# Patient Record
Sex: Male | Born: 1975 | Race: White | Hispanic: No | Marital: Single | Smoking: Never smoker
Health system: Southern US, Community
[De-identification: ages and names within clinical notes are randomized; demographics above are authoritative.]

---

## 2013-09-09 ENCOUNTER — Ambulatory Visit: Payer: 59 | Admitting: Emergency Medicine

## 2013-09-09 VITALS — BP 118/70 | HR 109 | Temp 98.7°F | Resp 16 | Ht 71.0 in | Wt 167.0 lb

## 2013-09-09 DIAGNOSIS — S335XXA Sprain of ligaments of lumbar spine, initial encounter: Secondary | ICD-10-CM

## 2013-09-09 MED ORDER — NAPROXEN SODIUM 550 MG PO TABS: 550.0000 mg | ORAL_TABLET | Freq: Two times a day (BID) | ORAL | Status: AC

## 2013-09-09 MED ORDER — CYCLOBENZAPRINE HCL 10 MG PO TABS: 10.0000 mg | ORAL_TABLET | Freq: Three times a day (TID) | ORAL | Status: DC | PRN

## 2013-09-09 MED ORDER — TRAMADOL HCL 50 MG PO TABS: 50.0000 mg | ORAL_TABLET | Freq: Four times a day (QID) | ORAL | Status: DC | PRN

## 2013-09-09 NOTE — Progress Notes (Signed)
[facility] [address] [place] [address] [phone]  Date:  09/09/2013   Name:  Leroy Richardson   DOB:  [birth year]   MRN:  [id number]  PCP:  No PCP Per Patient    Chief Complaint: Back Pain   History of Present Illness:  Leroy Richardson is a 37 y.o. very pleasant male patient who presents with the following:  Says he has experienced intermittent pain in his low back related to sports.  He has never experienced sciatica previously but today developed pain down his left leg from buttock to the foot associated with some paresthesias.  No history of direct injury or overuse.  No weakness. Pain worse when sits.  Less when stands and walks or lays.  No improvement with over the counter medications or other home remedies. Denies other complaint or health concern today.   There are no active problems to display for this patient.   No past medical history on file.  No past surgical history on file.  History  Substance Use Topics  . Smoking status: Never Smoker   . Smokeless tobacco: Not on file  . Alcohol Use: Not on file    No family history on file.  No Known Allergies  Medication list has been reviewed and updated.  No current outpatient prescriptions on file prior to visit.   No current facility-administered medications on file prior to visit.    Review of Systems:  As per HPI, otherwise negative.    Physical Examination: Filed Vitals:   09/09/13 2038  BP: 118/70  Pulse: 109  Temp: 98.7 F (37.1 C)  Resp: 16   Filed Vitals:   09/09/13 2038  Height: 5\' 11"  (1.803 m)  Weight: 167 lb (75.751 kg)   Body mass index is 23.3 kg/(m^2). Ideal Body Weight: Weight in (lb) to have BMI = 25: 178.9   GEN: WDWN, NAD, Non-toxic, Alert & Oriented x 3 HEENT: Atraumatic, Normocephalic.  Ears and Nose: No external deformity. EXTR: No clubbing/cyanosis/edema NEURO: Normal gait.  PSYCH: Normally interactive. Conversant. Not depressed or  anxious appearing.  Calm demeanor.  Back:  Tender left sciatic notch.  SLR negative.  Motor and DTR within normal limits  Assessment and Plan: Lumbar strain with sciatic neuritis Anaprox Flexeril Ultram Local heat  Signed,  Phillips Odor, MD

## 2013-09-09 NOTE — Patient Instructions (Signed)

## 2013-09-12 ENCOUNTER — Telehealth: Payer: Self-pay

## 2013-09-12 NOTE — Telephone Encounter (Signed)
Patient advised.

## 2013-09-12 NOTE — Telephone Encounter (Signed)
Would expect improvement within then next 1-2 wks.  If pain persists after that or is worsening, could consider referral.  At this point, specialist would likely recommend what we have already told him to do

## 2013-09-12 NOTE — Telephone Encounter (Signed)
Patient was seen Monday for a pinched nerve in his back. States that he is still in pain and wants to know what is the time frame for the pain to disappear or if he should schedule an appointment with a specialist instead. [facility] [address]  [phone]

## 2013-09-12 NOTE — Telephone Encounter (Signed)
Is it okay to refer? Or should he come in for recheck?

## 2013-11-22 ENCOUNTER — Ambulatory Visit: Payer: 59 | Admitting: Family Medicine

## 2013-11-22 VITALS — BP 118/80 | HR 94 | Temp 98.2°F | Resp 18 | Ht 70.0 in | Wt 171.2 lb

## 2013-11-22 DIAGNOSIS — J029 Acute pharyngitis, unspecified: Secondary | ICD-10-CM

## 2013-11-22 LAB — POCT RAPID STREP A (OFFICE): Rapid Strep A Screen: NEGATIVE

## 2013-11-22 NOTE — Patient Instructions (Signed)
You most likely have a viral sore throat- it appears that the worst is now over.   Rest, fluids, OTC motrin and throat spray as needed.  Let me know if you are not feeling better in the next few days.

## 2013-11-22 NOTE — Progress Notes (Signed)
[facility] [address], [place] [id number] [phone]  Date:  11/22/2013   Name:  Leroy KillingsJohn Richardson   DOB:  August 18, 1976   MRN:  [id number]  PCP:  No PCP Per Patient    Chief Complaint: Sore Throat and Fever   History of Present Illness:  Leroy Richardson is a 38 y.o. very pleasant male patient who presents with the following:  He is here today with a ST for several days.  He has noted a temp to 102.  Fever is now resolved and he feels somewhat better today.  He feels ok excpet for ST.  He has looked and seen sores on the back of his throat.    He is generally very healthy.    No cough, no GI complaints.  He has felt tired and feverish.     He did have some body aches and chills along with his fever.   No nasal sx.    He has used ibuprofen- last dose was last night.  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   History reviewed. No pertinent past medical history.  History reviewed. No pertinent past surgical history.  History  Substance Use Topics  . Smoking status: Never Smoker   . Smokeless tobacco: Not on file  . Alcohol Use: Not on file    History reviewed. No pertinent family history.  No Known Allergies  Medication list has been reviewed and updated.  Current Outpatient Prescriptions on File Prior to Visit  Medication Sig Dispense Refill  . cyclobenzaprine (FLEXERIL) 10 MG tablet Take 1 tablet (10 mg total) by mouth 3 (three) times daily as needed for muscle spasms.  30 tablet  0  . naproxen sodium (ANAPROX DS) 550 MG tablet Take 1 tablet (550 mg total) by mouth 2 (two) times daily with a meal.  40 tablet  0  . traMADol (ULTRAM) 50 MG tablet Take 1 tablet (50 mg total) by mouth every 6 (six) hours as needed.  30 tablet  0   No current facility-administered medications on file prior to visit.    Review of Systems:  As per HPI- otherwise  negative.   Physical Examination: Filed Vitals:   11/22/13 1101  BP: 118/80  Pulse: 94  Temp: 98.2 F (36.8 C)  Resp: 18   Filed Vitals:   11/22/13 1101  Height: 5\' 10"  (1.778 m)  Weight: 171 lb 3.2 oz (77.656 kg)   Body mass index is 24.56 kg/(m^2). Ideal Body Weight: Weight in (lb) to have BMI = 25: 173.9  GEN: WDWN, NAD, Non-toxic, A & O x 3, looks well HEENT: Atraumatic, Normocephalic. Neck supple. No masses, No LAD.  Bilateral TM wnl, oropharynx shows tiny ulcers on bilateral tonsils.  No tonsillar enlargement or exudate.  PEERL,EOMI.  Ears and Nose: No external deformity. CV: RRR, No M/G/R. No JVD. No thrill. No extra heart sounds. PULM: CTA B, no wheezes, crackles, rhonchi. No retractions. No resp. distress. No accessory muscle use. EXTR: No c/c/e NEURO Normal gait.  PSYCH: Normally interactive. Conversant. Not depressed or anxious appearing.  Calm demeanor.    Results for orders placed in visit on 11/22/13  POCT RAPID STREP A (OFFICE)      Result Value Ref Range   Rapid Strep A Screen Negative  Negative    Assessment and Plan: Acute pharyngitis - Plan: POCT rapid strep A,  CANCELED: Culture, Group A Strep  Suspect viral pharyngitis.  Discussed doing a viral/ HSV culture but he declines this for now.  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  HSV 2 is possible but less likely.  He may have a blood titer in a couple of months if he likes.  He has not had conjunctivitis so doubt adenovirus.   He will continue supportive therapy and let me know if not better.  He does not wish to have a strep culture either as bacterial etiology is unlikely.    Signed Abbe Amsterdam, MD

## 2018-02-14 DIAGNOSIS — Z111 Encounter for screening for respiratory tuberculosis: Secondary | ICD-10-CM | POA: Diagnosis not present

## 2018-06-19 ENCOUNTER — Ambulatory Visit (INDEPENDENT_AMBULATORY_CARE_PROVIDER_SITE_OTHER): Payer: BLUE CROSS/BLUE SHIELD

## 2018-06-19 ENCOUNTER — Ambulatory Visit (INDEPENDENT_AMBULATORY_CARE_PROVIDER_SITE_OTHER): Payer: BLUE CROSS/BLUE SHIELD | Admitting: Sports Medicine

## 2018-06-19 ENCOUNTER — Encounter: Payer: Self-pay | Admitting: Sports Medicine

## 2018-06-19 ENCOUNTER — Encounter (INDEPENDENT_AMBULATORY_CARE_PROVIDER_SITE_OTHER): Payer: Self-pay

## 2018-06-19 DIAGNOSIS — M545 Low back pain, unspecified: Secondary | ICD-10-CM | POA: Insufficient documentation

## 2018-06-19 DIAGNOSIS — M2550 Pain in unspecified joint: Secondary | ICD-10-CM

## 2018-06-19 DIAGNOSIS — Z0000 Encounter for general adult medical examination without abnormal findings: Secondary | ICD-10-CM

## 2018-06-19 DIAGNOSIS — N644 Mastodynia: Secondary | ICD-10-CM | POA: Insufficient documentation

## 2018-06-19 DIAGNOSIS — G8929 Other chronic pain: Secondary | ICD-10-CM

## 2018-06-19 DIAGNOSIS — R5383 Other fatigue: Secondary | ICD-10-CM | POA: Insufficient documentation

## 2018-06-19 MED ORDER — DOXYCYCLINE HYCLATE 100 MG PO TABS: 100.0000 mg | ORAL_TABLET | Freq: Two times a day (BID) | ORAL | 0 refills | Status: AC

## 2018-06-19 NOTE — Assessment & Plan Note (Signed)
Checking routine labs, annual physical today.

## 2018-06-19 NOTE — Assessment & Plan Note (Signed)
Continue naproxen, may use ibuprofen on other days as needed. Adding rheumatoid work-up.

## 2018-06-19 NOTE — Assessment & Plan Note (Signed)
Mostly discogenic back pain. Rehab exercises given, adding some L-spine baseline x-rays.

## 2018-06-19 NOTE — Assessment & Plan Note (Signed)
Left bigger than right breast asymmetry. Fullness on the lateral aspect of the left breast, with tenderness, this may very well be a small abscess. We are going to do 7 days of doxycycline. If symptoms do not completely resolve we will get a full ultrasound evaluation at the breast center.

## 2018-06-19 NOTE — Progress Notes (Signed)
Subjective:    CC: New physical  HPI:  Leroy Richardson is a new patient, here for his physical, he has a few complaints.  Fatigue: Nonspecific, would like testosterone levels checked.  Polyarthralgias: With significant morning stiffness, occasional low back pain, all is very mild.  No family history of rheumatoid arthritis.  Left chest wall pain: With some fullness over the left lateral breast.  No family history of breast cancer.  For about a week and a half.  I reviewed the past medical history, family history, social history, surgical history, and allergies today and no changes were needed.  Please see the problem list section below in epic for further details.  Past Medical History: No past medical history on file. Past Surgical History: No past surgical history on file. Social History: Social History   Socioeconomic History  . Marital status: Single    Spouse name: Not on file  . Number of children: Not on file  . Years of education: Not on file  . Highest education level: Not on file  Occupational History  . Not on file  Social Needs  . Financial resource strain: Not on file  . Food insecurity:    Worry: Not on file    Inability: Not on file  . Transportation needs:    Medical: Not on file    Non-medical: Not on file  Tobacco Use  . Smoking status: Never Smoker  . Smokeless tobacco: Never Used  Substance and Sexual Activity  . Alcohol use: Yes  . Drug use: Never  . Sexual activity: Yes  Lifestyle  . Physical activity:    Days per week: Not on file    Minutes per session: Not on file  . Stress: Not on file  Relationships  . Social connections:    Talks on phone: Not on file    Gets together: Not on file    Attends religious service: Not on file    Active member of club or organization: Not on file    Attends meetings of clubs or organizations: Not on file    Relationship status: Not on file  Other Topics Concern  . Not on file  Social History Narrative  . Not  on file   Family History: Family History  Problem Relation Age of Onset  . Cancer Mother        uterine   Allergies: No Known Allergies Medications: See med rec.  Review of Systems: No headache, visual changes, nausea, vomiting, diarrhea, constipation, dizziness, abdominal pain, skin rash, fevers, chills, night sweats, swollen lymph nodes, weight loss, chest pain, body aches, joint swelling, muscle aches, shortness of breath, mood changes, visual or auditory hallucinations.  Objective:    General: Well Developed, well nourished, and in no acute distress.  Neuro: Alert and oriented x3, extra-ocular muscles intact, sensation grossly intact. Cranial nerves II through XII are intact, motor, sensory, and coordinative functions are all intact. HEENT: Normocephalic, atraumatic, pupils equal round reactive to light, neck supple, no masses, no lymphadenopathy, thyroid nonpalpable. Oropharynx, nasopharynx, external ear canals are unremarkable. Skin: Warm and dry, no rashes noted.  Cardiac: Regular rate and rhythm, no murmurs rubs or gallops.  Respiratory: Clear to auscultation bilaterally. Not using accessory muscles, speaking in full sentences.  Abdominal: Soft, nontender, nondistended, positive bowel sounds, no masses, no organomegaly.  Musculoskeletal: Shoulder, elbow, wrist, hip, knee, ankle stable, and with full range of motion. Chest wall: Asymmetry with enlargement of the left breast, tenderness over the lateral aspect of the  left breast at about the 2 o'clock position, 2 cm from the nipple.  Mild fullness but no discrete mass.  Impression and Recommendations:    The patient was counselled, risk factors were discussed, anticipatory guidance given.  Polyarthralgia Continue naproxen, may use ibuprofen on other days as needed. Adding rheumatoid work-up.  Annual physical exam Checking routine labs, annual physical today.  Fatigue Checking testosterone levels.  Pain of left  breast Left bigger than right breast asymmetry. Fullness on the lateral aspect of the left breast, with tenderness, this may very well be a small abscess. We are going to do 7 days of doxycycline. If symptoms do not completely resolve we will get a full ultrasound evaluation at the breast center.  Low back pain Mostly discogenic back pain. Rehab exercises given, adding some L-spine baseline x-rays. ___________________________________________ Ihor Austinhomas J. Benjamin Stainhekkekandam, M.D., ABFM., CAQSM. Primary Care and Sports Medicine [facility]  Adjunct Instructor of Family Medicine  [employer]

## 2018-06-19 NOTE — Assessment & Plan Note (Signed)
Checking testosterone levels.

## 2018-06-22 DIAGNOSIS — Z0000 Encounter for general adult medical examination without abnormal findings: Secondary | ICD-10-CM | POA: Diagnosis not present

## 2018-06-22 DIAGNOSIS — R5383 Other fatigue: Secondary | ICD-10-CM | POA: Diagnosis not present

## 2018-06-22 DIAGNOSIS — M2550 Pain in unspecified joint: Secondary | ICD-10-CM | POA: Diagnosis not present

## 2018-06-29 LAB — CBC
HCT: 43.4 % (ref 38.5–50.0)
Hemoglobin: 14.8 g/dL (ref 13.2–17.1)
MCH: 28.7 pg (ref 27.0–33.0)
MCHC: 34.1 g/dL (ref 32.0–36.0)
MCV: 84.3 fL (ref 80.0–100.0)
MPV: 9.8 fL (ref 7.5–12.5)
Platelets: 212 Thousand/uL (ref 140–400)
RBC: 5.15 10*6/uL (ref 4.20–5.80)
RDW: 12.1 % (ref 11.0–15.0)
WBC: 3.8 Thousand/uL (ref 3.8–10.8)

## 2018-06-29 LAB — LIPID PANEL W/REFLEX DIRECT LDL
Cholesterol: 186 mg/dL (ref <200)
HDL: 50 mg/dL (ref >40)
LDL Cholesterol (Calc): 121 mg/dL (calc) — ABNORMAL HIGH
Non-HDL Cholesterol (Calc): 136 mg/dL — ABNORMAL HIGH (ref <130)
Total CHOL/HDL Ratio: 3.7 (calc) (ref <5.0)
Triglycerides: 60 mg/dL (ref <150)

## 2018-06-29 LAB — COMPREHENSIVE METABOLIC PANEL
AG Ratio: 2.3 (calc) (ref 1.0–2.5)
ALT: 13 U/L (ref 9–46)
BUN: 15 mg/dL (ref 7–25)
Calcium: 10 mg/dL (ref 8.6–10.3)
Chloride: 103 mmol/L (ref 98–110)
Glucose, Bld: 88 mg/dL (ref 65–99)
Potassium: 4.3 mmol/L (ref 3.5–5.3)
Sodium: 139 mmol/L (ref 135–146)
Total Bilirubin: 0.6 mg/dL (ref 0.2–1.2)
Total Protein: 6.9 g/dL (ref 6.1–8.1)

## 2018-06-29 LAB — COMPREHENSIVE METABOLIC PANEL WITH GFR
AST: 19 U/L (ref 10–40)
Albumin: 4.8 g/dL (ref 3.6–5.1)
Alkaline phosphatase (APISO): 63 U/L (ref 40–115)
CO2: 29 mmol/L (ref 20–32)
Creat: 0.82 mg/dL (ref 0.60–1.35)
Globulin: 2.1 g/dL (ref 1.9–3.7)

## 2018-06-29 LAB — ANA, IFA COMPREHENSIVE PANEL
Anti Nuclear Antibody(ANA): NEGATIVE
ENA SM Ab Ser-aCnc: <1 AI
SM/RNP: <1 AI
SSA (Ro) (ENA) Antibody, IgG: <1 AI
SSB (La) (ENA) Antibody, IgG: <1 AI
Scleroderma (Scl-70) (ENA) Antibody, IgG: <1 AI
ds DNA Ab: <1 IU/mL

## 2018-06-29 LAB — RHEUMATOID ARTHRITIS DIAGNOSTIC PANEL, COMPREHENSIVE
Cyclic Citrullin Peptide Ab: <16 Units (ref <20)
Rheumatoid Factor (IgA): <5 U (ref <=6)
Rheumatoid Factor (IgG): <5 U (ref <=6)
Rheumatoid Factor (IgM): <5 U (ref <=6)
SSA (Ro) (ENA) Antibody, IgG: <1 AI
SSB (La) (ENA) Antibody, IgG: <1 AI

## 2018-06-29 LAB — HEMOGLOBIN A1C
Hgb A1c MFr Bld: 5.1 %{Hb} (ref <5.7)
Mean Plasma Glucose: 100 (calc)
eAG (mmol/L): 5.5 (calc)

## 2018-06-29 LAB — TESTOSTERONE, FREE & TOTAL
Free Testosterone: 53.4 pg/mL (ref 35.0–155.0)
Testosterone, Total, LC-MS-MS: 481 ng/dL (ref 250–1100)

## 2018-06-29 LAB — SEDIMENTATION RATE: Sed Rate: 2 mm/h (ref 0–15)

## 2018-06-29 LAB — TSH: TSH: 1.32 mIU/L (ref 0.40–4.50)

## 2018-06-29 LAB — URIC ACID: Uric Acid, Serum: 5.6 mg/dL (ref 4.0–8.0)

## 2018-06-29 LAB — VITAMIN D 25 HYDROXY (VIT D DEFICIENCY, FRACTURES): Vit D, 25-Hydroxy: 33 ng/mL (ref 30–100)

## 2018-06-29 LAB — CK: Total CK: 71 U/L (ref 44–196)

## 2018-07-03 ENCOUNTER — Encounter: Payer: Self-pay | Admitting: Sports Medicine

## 2018-07-03 ENCOUNTER — Ambulatory Visit (INDEPENDENT_AMBULATORY_CARE_PROVIDER_SITE_OTHER): Payer: BLUE CROSS/BLUE SHIELD | Admitting: Sports Medicine

## 2018-07-03 ENCOUNTER — Telehealth: Payer: Self-pay | Admitting: *Deleted

## 2018-07-03 DIAGNOSIS — N644 Mastodynia: Secondary | ICD-10-CM | POA: Diagnosis not present

## 2018-07-03 DIAGNOSIS — M545 Low back pain: Secondary | ICD-10-CM

## 2018-07-03 DIAGNOSIS — M2550 Pain in unspecified joint: Secondary | ICD-10-CM

## 2018-07-03 DIAGNOSIS — R5383 Other fatigue: Secondary | ICD-10-CM | POA: Diagnosis not present

## 2018-07-03 DIAGNOSIS — G8929 Other chronic pain: Secondary | ICD-10-CM

## 2018-07-03 NOTE — Assessment & Plan Note (Signed)
Resolved, labs including testosterone levels were normal.

## 2018-07-03 NOTE — Assessment & Plan Note (Signed)
Fullness in the left breast. I suspected possibly a small abscess/mastitis, we did 7 days of doxycycline, only slight improvement in pain, fullness is still present. We are going to proceed with full ultrasound evaluation at the breast center.

## 2018-07-03 NOTE — Assessment & Plan Note (Signed)
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 Continue with rehab exercises, naproxen and return as needed.

## 2018-07-03 NOTE — Telephone Encounter (Signed)
Sounds like the doxycycline was effective, no need to come in as long as the asymmetry is improving or resolved.

## 2018-07-03 NOTE — Telephone Encounter (Signed)
Pt left vm stating that the left breast tenderness is almost completely gone and wanted to know if you still wanted him to come in for the f/u this afternoon.  Please advise.

## 2018-07-03 NOTE — Progress Notes (Signed)
Subjective:    CC: Follow-up  HPI: Fatigue: Resolved.  Low back pain: Mild lumbar DDD on x-rays, does well with rehab exercises and naproxen.  Left breast mass: With fullness, we tried a 7-day course of doxycycline, persistent fullness and pain.  I reviewed the past medical history, family history, social history, surgical history, and allergies today and no changes were needed.  Please see the problem list section below in epic for further details.  Past Medical History: No past medical history on file. Past Surgical History: No past surgical history on file. Social History: Social History   Socioeconomic History  . Marital status: Single    Spouse name: Not on file  . Number of children: Not on file  . Years of education: Not on file  . Highest education level: Not on file  Occupational History  . Not on file  Social Needs  . Financial resource strain: Not on file  . Food insecurity:    Worry: Not on file    Inability: Not on file  . Transportation needs:    Medical: Not on file    Non-medical: Not on file  Tobacco Use  . Smoking status: Never Smoker  . Smokeless tobacco: Never Used  Substance and Sexual Activity  . Alcohol use: Yes  . Drug use: Never  . Sexual activity: Yes  Lifestyle  . Physical activity:    Days per week: Not on file    Minutes per session: Not on file  . Stress: Not on file  Relationships  . Social connections:    Talks on phone: Not on file    Gets together: Not on file    Attends religious service: Not on file    Active member of club or organization: Not on file    Attends meetings of clubs or organizations: Not on file    Relationship status: Not on file  Other Topics Concern  . Not on file  Social History Narrative  . Not on file   Family History: Family History  Problem Relation Age of Onset  . Cancer Mother        uterine   Allergies: No Known Allergies Medications: See med rec.  Review of Systems: No fevers,  chills, night sweats, weight loss, chest pain, or shortness of breath.   Objective:    General: Well Developed, well nourished, and in no acute distress.  Neuro: Alert and oriented x3, extra-ocular muscles intact, sensation grossly intact.  HEENT: Normocephalic, atraumatic, pupils equal round reactive to light, neck supple, no masses, no lymphadenopathy, thyroid nonpalpable.  Skin: Warm and dry, no rashes. Cardiac: Regular rate and rhythm, no murmurs rubs or gallops, no lower extremity edema.  Respiratory: Clear to auscultation bilaterally. Not using accessory muscles, speaking in full sentences. Chest wall: Asymmetry with enlargement of the left breast, tenderness over the lateral aspect of the left breast at about the 2 o'clock position, 2 cm from the nipple.  Mild fullness but no discrete mass.  Impression and Recommendations:    Polyarthralgia Continue with naproxen. Rheumatoid work-up is negative, no further intervention needed.  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 Continue with rehab exercises, naproxen and return as needed.  Fatigue Resolved, labs including testosterone levels were normal.  Pain of left breast Fullness in the left breast. I suspected possibly a small abscess/mastitis, we did 7 days of doxycycline, only slight improvement in pain, fullness is still present. We are going to proceed with  full ultrasound evaluation at the breast center. ___________________________________________ Leroy Richardson. Leroy Richardson, M.D., ABFM., CAQSM. Primary Care and Sports Medicine [facility]  Adjunct Instructor of Family Medicine  [employer]

## 2018-07-03 NOTE — Telephone Encounter (Signed)
LMOM for pt to call office to cancel his appointment if that's what he wants to do.

## 2018-07-03 NOTE — Assessment & Plan Note (Signed)
Continue with naproxen. Rheumatoid work-up is negative, no further intervention needed.

## 2018-10-25 ENCOUNTER — Telehealth: Payer: Self-pay | Admitting: Sports Medicine

## 2018-10-25 MED ORDER — SCOPOLAMINE 1 MG/3DAYS TD PT72: 1.0000 | MEDICATED_PATCH | TRANSDERMAL | 3 refills | Status: AC

## 2018-10-25 NOTE — Telephone Encounter (Signed)
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

## 2018-10-29 ENCOUNTER — Ambulatory Visit
Admission: RE | Admit: 2018-10-29 | Discharge: 2018-10-29 | Disposition: A | Discharge status: Home / Self Care | Payer: BLUE CROSS/BLUE SHIELD | Source: Ambulatory Visit | Attending: Sports Medicine | Admitting: Sports Medicine

## 2018-10-29 ENCOUNTER — Ambulatory Visit: Payer: BLUE CROSS/BLUE SHIELD

## 2018-10-29 DIAGNOSIS — N644 Mastodynia: Secondary | ICD-10-CM

## 2018-10-29 DIAGNOSIS — R928 Other abnormal and inconclusive findings on diagnostic imaging of breast: Secondary | ICD-10-CM | POA: Diagnosis not present

## 2018-10-29 DIAGNOSIS — N6489 Other specified disorders of breast: Secondary | ICD-10-CM | POA: Diagnosis not present

## 2019-03-26 DIAGNOSIS — Z20828 Contact with and (suspected) exposure to other viral communicable diseases: Secondary | ICD-10-CM | POA: Diagnosis not present

## 2019-09-20 IMAGING — MG DIGITAL DIAGNOSTIC BILATERAL MAMMOGRAM WITH TOMO AND CAD
6 of 10 series · 6 of 30 positions shown · non-contrast
Comparison: Previous exam(s).

CLINICAL DATA: Focal pain and palpable lump left breast.

EXAM:
DIGITAL DIAGNOSTIC BILATERAL MAMMOGRAM WITH CAD AND TOMO
ULTRASOUND LEFT BREAST

[R CC synth-2D]
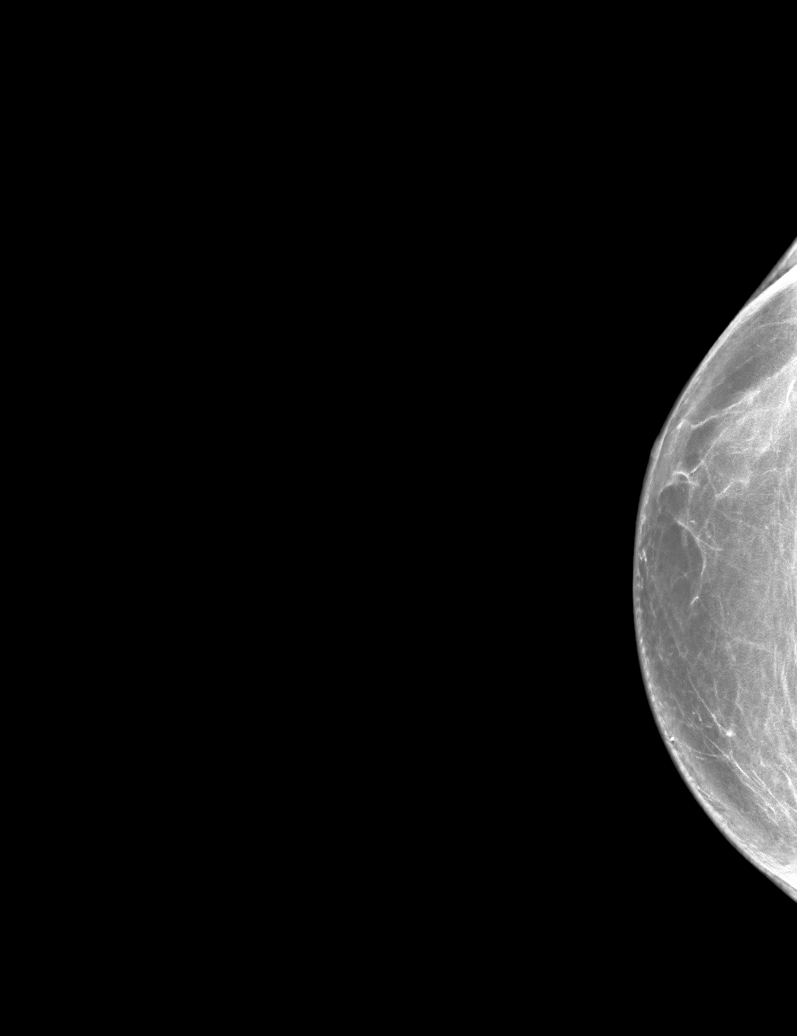

[L TAN synth-2D]
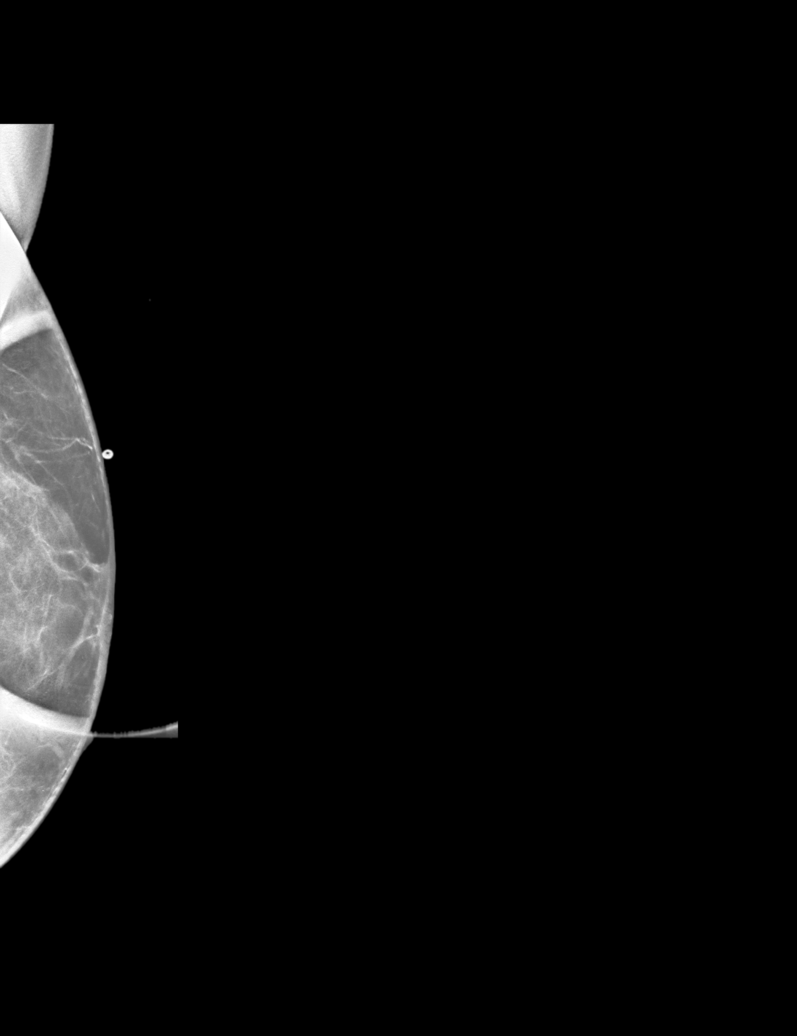

[R MLO synth-2D]
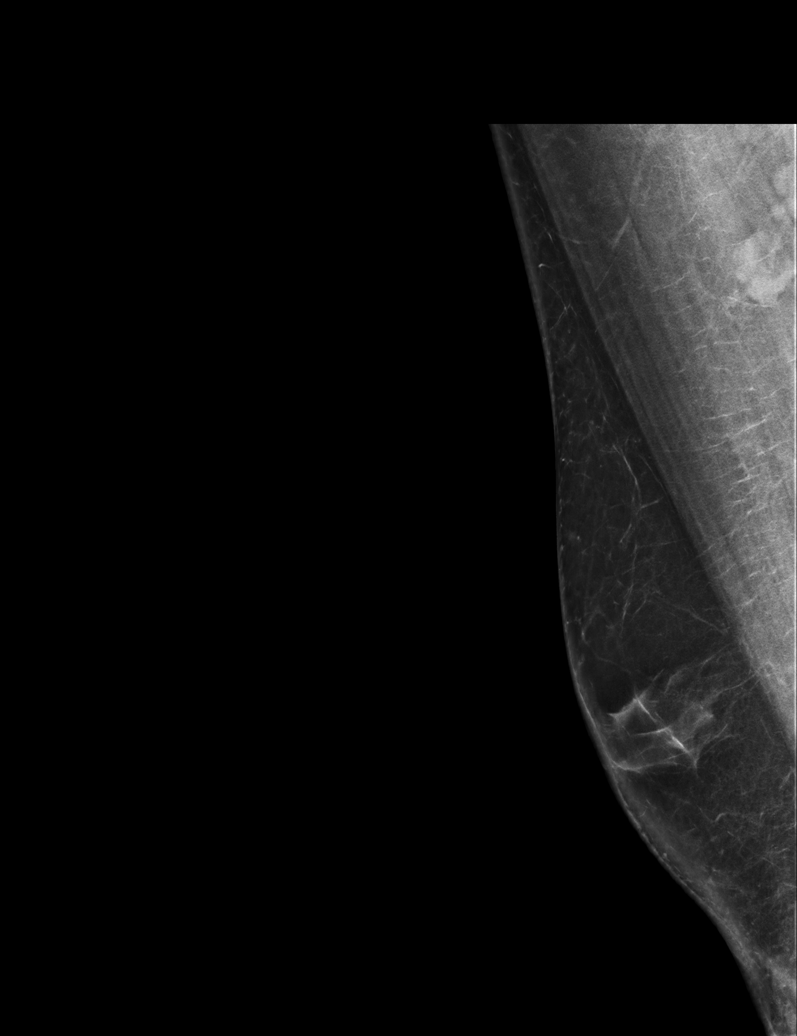

[L CC synth-2D]
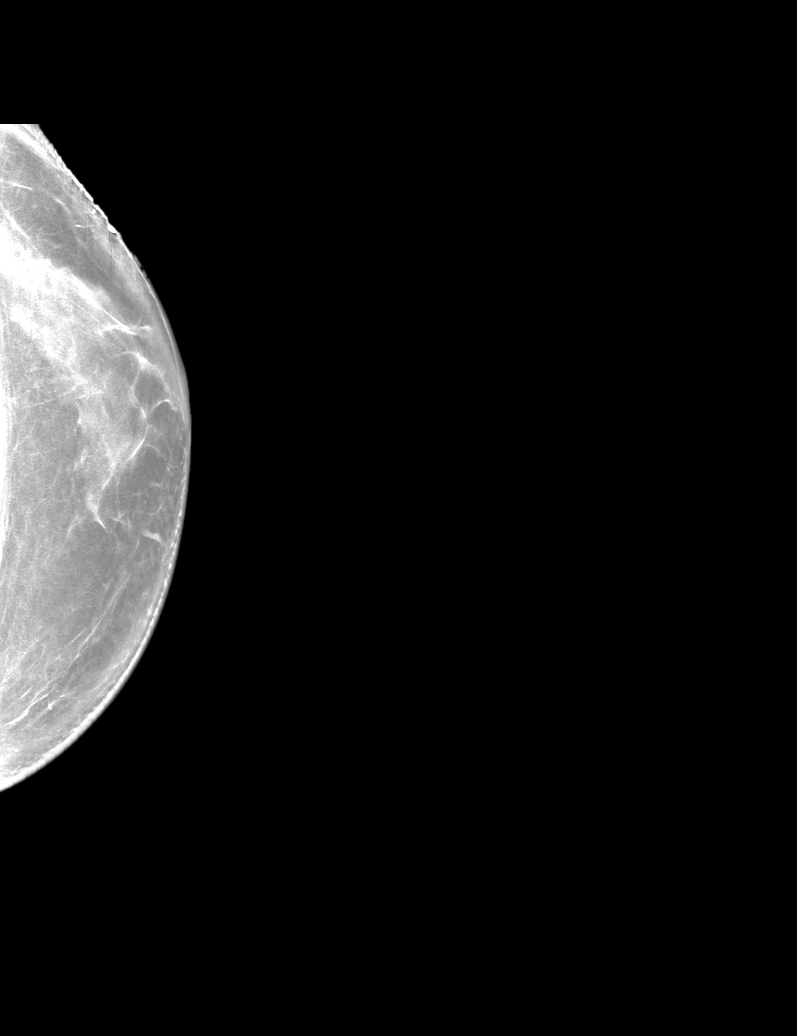

[L MLO synth-2D]
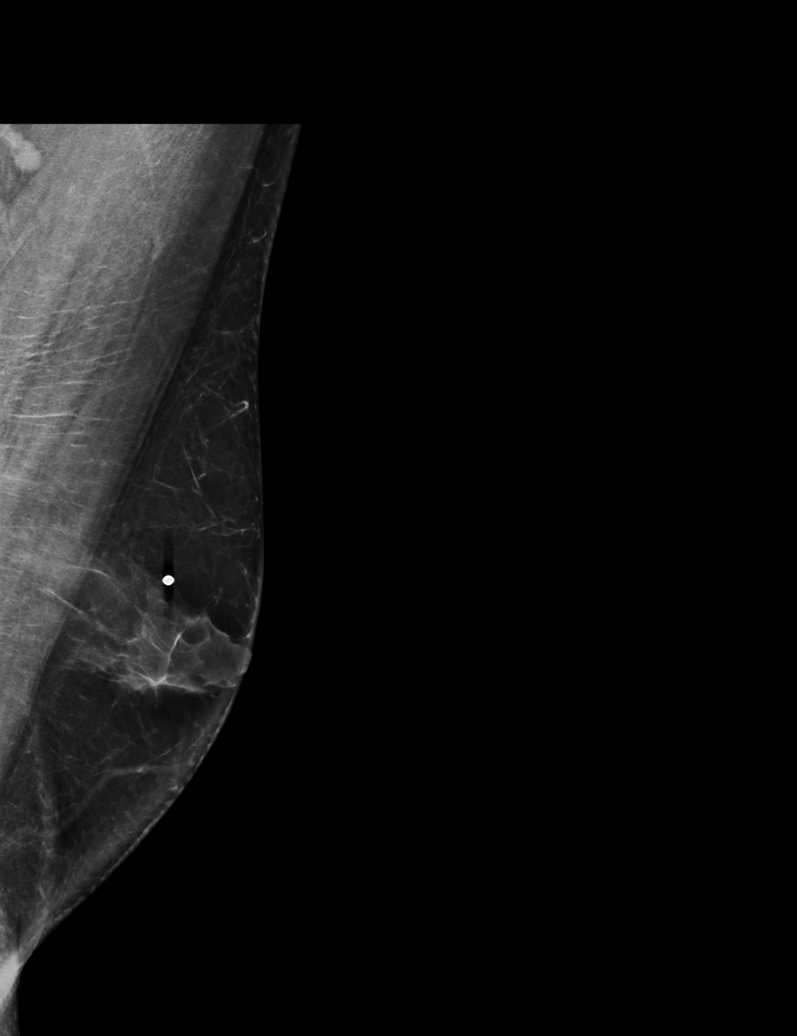

[R CC tomo · tomo slice 39/78.0]
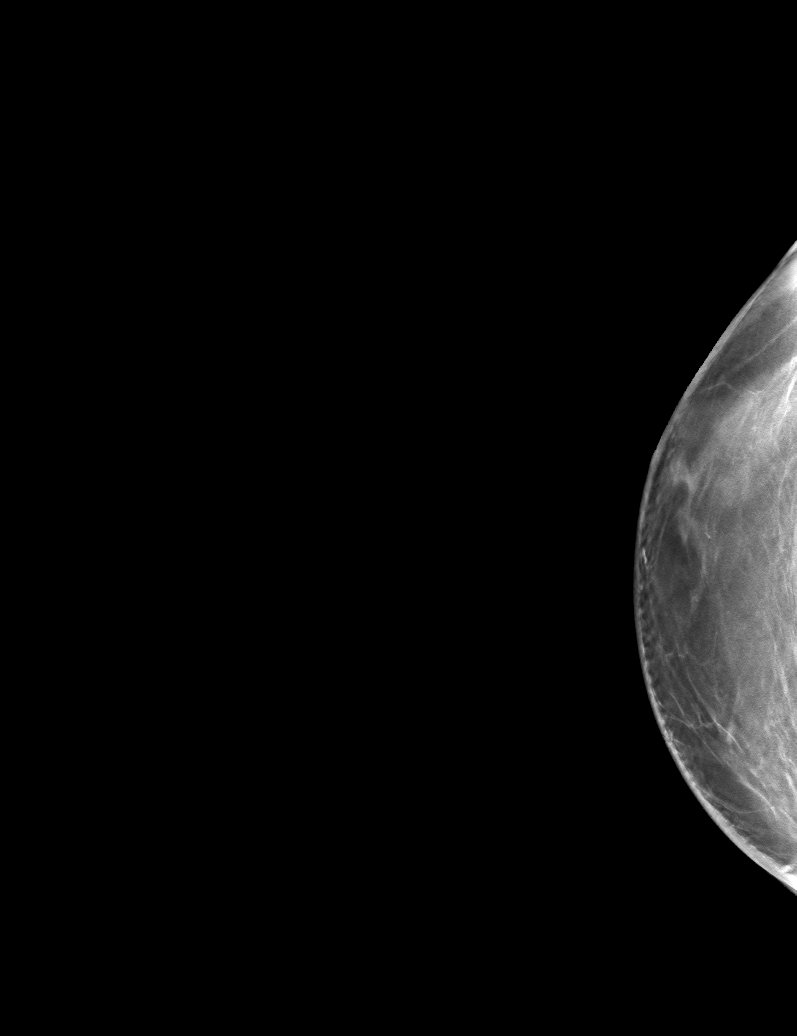

[6 of 30 positions shown; findings below may reference images not displayed]

ACR Breast Density Category b: There are scattered areas of
fibroglandular density.
FINDINGS: Cc and MLO views of bilateral breasts, spot tangential view of left
breast are submitted. There is bilateral gynecomastia, left greater
than right.

Mammographic images were processed with CAD.

Targeted ultrasound is performed, showing changes of gynecomastia in
the palpable area upper-outer quadrant left breast. No focal
abnormal discrete cystic or solid lesion is identified.
IMPRESSION: Benign findings.

RECOMMENDATION:
Management on clinical basis.

I have discussed the findings and recommendations with the patient.
Results were also provided in writing at the conclusion of the
visit. If applicable, a reminder letter will be sent to the patient
regarding the next appointment.

BI-RADS CATEGORY  2: Benign.

## 2019-11-05 DIAGNOSIS — L718 Other rosacea: Secondary | ICD-10-CM | POA: Diagnosis not present

## 2019-11-05 DIAGNOSIS — B351 Tinea unguium: Secondary | ICD-10-CM | POA: Diagnosis not present

## 2019-12-01 ENCOUNTER — Ambulatory Visit: Payer: BC Managed Care – PPO | Attending: Internal Medicine

## 2019-12-01 DIAGNOSIS — Z23 Encounter for immunization: Secondary | ICD-10-CM

## 2019-12-01 NOTE — Progress Notes (Signed)
   Covid-19 Vaccination Clinic  Name:  Champ Keetch    MRN: [id number] DOB: October 12, 1975  12/01/2019  Mr. Purdy was observed post Covid-19 immunization for 15 minutes without incident. He was provided with Vaccine Information Sheet and instruction to access the V-Safe system.   Mr. Pizzi was instructed to call 911 with any severe reactions post vaccine: Marland Kitchen Difficulty breathing  . Swelling of face and throat  . A fast heartbeat  . A bad rash all over body  . Dizziness and weakness   Immunizations Administered    Name Date Dose VIS Date Route   Pfizer COVID-19 Vaccine 12/01/2019  5:11 PM 0.3 mL 09/06/2019 Intramuscular   Manufacturer: [employer], [employer]   Lot: [id number]   NDC: [address]-8

## 2019-12-03 DIAGNOSIS — L718 Other rosacea: Secondary | ICD-10-CM | POA: Diagnosis not present

## 2020-01-01 ENCOUNTER — Ambulatory Visit: Payer: BC Managed Care – PPO | Attending: Internal Medicine

## 2020-01-01 DIAGNOSIS — Z23 Encounter for immunization: Secondary | ICD-10-CM

## 2020-01-01 NOTE — Progress Notes (Signed)
   Covid-19 Vaccination Clinic  Name:  Lorimer Tiberio    MRN: [id number] DOB: 1975-11-02  01/01/2020  Mr. Hassebrock was observed post Covid-19 immunization for 15 minutes without incident. He was provided with Vaccine Information Sheet and instruction to access the V-Safe system.   Mr. Vansickle was instructed to call 911 with any severe reactions post vaccine: Marland Kitchen Difficulty breathing  . Swelling of face and throat  . A fast heartbeat  . A bad rash all over body  . Dizziness and weakness   Immunizations Administered    Name Date Dose VIS Date Route   Pfizer COVID-19 Vaccine 01/01/2020  3:57 PM 0.3 mL 09/06/2019 Intramuscular   Manufacturer: [employer], [employer]   Lot: [id number]   NDC: [address]-3
